# Patient Record
Sex: Female | Born: 1970 | Race: Black or African American | Hispanic: No | Marital: Single | Smoking: Never smoker
Health system: Southern US, Community
[De-identification: ages and names within clinical notes are randomized; demographics above are authoritative.]

## PROBLEM LIST (undated history)

## (undated) HISTORY — PX: WISDOM TOOTH EXTRACTION: SHX21

---

## 1998-12-09 ENCOUNTER — Encounter: Payer: Self-pay | Admitting: Family Medicine

## 1998-12-09 ENCOUNTER — Ambulatory Visit (HOSPITAL_COMMUNITY): Admission: RE | Admit: 1998-12-09 | Discharge: 1998-12-09 | Payer: Self-pay | Admitting: Family Medicine

## 2007-06-19 ENCOUNTER — Emergency Department (HOSPITAL_COMMUNITY): Admission: EM | Admit: 2007-06-19 | Discharge: 2007-06-19 | Payer: Self-pay | Admitting: Emergency Medicine

## 2007-06-22 ENCOUNTER — Emergency Department (HOSPITAL_COMMUNITY): Admission: EM | Admit: 2007-06-22 | Discharge: 2007-06-22 | Payer: Self-pay | Admitting: Family Medicine

## 2007-06-23 ENCOUNTER — Emergency Department (HOSPITAL_COMMUNITY): Admission: EM | Admit: 2007-06-23 | Discharge: 2007-06-23 | Payer: Self-pay | Admitting: Family Medicine

## 2007-06-24 ENCOUNTER — Emergency Department (HOSPITAL_COMMUNITY): Admission: EM | Admit: 2007-06-24 | Discharge: 2007-06-24 | Payer: Self-pay | Admitting: Family Medicine

## 2009-03-30 ENCOUNTER — Emergency Department (HOSPITAL_COMMUNITY): Admission: EM | Admit: 2009-03-30 | Discharge: 2009-03-30 | Payer: Self-pay | Admitting: Family Medicine

## 2009-04-08 ENCOUNTER — Emergency Department (HOSPITAL_COMMUNITY): Admission: EM | Admit: 2009-04-08 | Discharge: 2009-04-08 | Payer: Self-pay | Admitting: Emergency Medicine

## 2010-05-18 ENCOUNTER — Ambulatory Visit (HOSPITAL_COMMUNITY): Admission: RE | Admit: 2010-05-18 | Discharge: 2010-05-18 | Payer: Self-pay | Admitting: Obstetrics & Gynecology

## 2010-05-26 ENCOUNTER — Emergency Department (HOSPITAL_COMMUNITY): Admission: EM | Admit: 2010-05-26 | Discharge: 2010-05-26 | Payer: Self-pay | Admitting: Emergency Medicine

## 2011-02-20 LAB — POCT PREGNANCY, URINE: Preg Test, Ur: NEGATIVE

## 2011-02-20 LAB — URINE MICROSCOPIC-ADD ON

## 2011-02-20 LAB — URINALYSIS, ROUTINE W REFLEX MICROSCOPIC
Bilirubin Urine: NEGATIVE
Glucose, UA: NEGATIVE mg/dL
Ketones, ur: NEGATIVE mg/dL
Nitrite: NEGATIVE
Protein, ur: NEGATIVE mg/dL
Specific Gravity, Urine: 1.021 (ref 1.005–1.030)
Urobilinogen, UA: 1 mg/dL (ref 0.0–1.0)
pH: 7 (ref 5.0–8.0)

## 2011-06-27 ENCOUNTER — Other Ambulatory Visit: Payer: Self-pay | Admitting: Family Medicine

## 2011-06-27 DIAGNOSIS — Z1231 Encounter for screening mammogram for malignant neoplasm of breast: Secondary | ICD-10-CM

## 2011-07-07 ENCOUNTER — Ambulatory Visit (HOSPITAL_COMMUNITY)
Admission: RE | Admit: 2011-07-07 | Discharge: 2011-07-07 | Disposition: A | Discharge status: Home / Self Care | Payer: Self-pay | Source: Ambulatory Visit | Attending: Family Medicine | Admitting: Family Medicine

## 2011-07-07 DIAGNOSIS — Z1231 Encounter for screening mammogram for malignant neoplasm of breast: Secondary | ICD-10-CM

## 2011-09-19 LAB — CBC
HCT: 39.2
Hemoglobin: 13.4
MCHC: 34
MCV: 91.3
Platelets: 354
RBC: 4.29
RDW: 12.4
WBC: 13.5 — ABNORMAL HIGH

## 2011-09-19 LAB — DIFFERENTIAL
Basophils Absolute: 0
Basophils Relative: 0
Eosinophils Absolute: 0.1
Eosinophils Relative: 0
Lymphocytes Relative: 16
Lymphs Abs: 2.1
Monocytes Absolute: 0.7
Monocytes Relative: 5
Neutro Abs: 10.6 — ABNORMAL HIGH
Neutrophils Relative %: 79 — ABNORMAL HIGH

## 2011-09-19 LAB — POCT RAPID STREP A
Streptococcus, Group A Screen (Direct): NEGATIVE
Streptococcus, Group A Screen (Direct): NEGATIVE

## 2011-10-21 ENCOUNTER — Emergency Department (HOSPITAL_COMMUNITY)
Admission: EM | Admit: 2011-10-21 | Discharge: 2011-10-21 | Disposition: A | Discharge status: Home / Self Care | Payer: Self-pay | Attending: Emergency Medicine | Admitting: Emergency Medicine

## 2011-10-21 DIAGNOSIS — R112 Nausea with vomiting, unspecified: Secondary | ICD-10-CM | POA: Insufficient documentation

## 2011-10-21 DIAGNOSIS — J029 Acute pharyngitis, unspecified: Secondary | ICD-10-CM | POA: Insufficient documentation

## 2011-10-21 DIAGNOSIS — R197 Diarrhea, unspecified: Secondary | ICD-10-CM | POA: Insufficient documentation

## 2011-10-21 DIAGNOSIS — R599 Enlarged lymph nodes, unspecified: Secondary | ICD-10-CM | POA: Insufficient documentation

## 2011-10-21 DIAGNOSIS — J069 Acute upper respiratory infection, unspecified: Secondary | ICD-10-CM | POA: Insufficient documentation

## 2011-10-21 DIAGNOSIS — A088 Other specified intestinal infections: Secondary | ICD-10-CM | POA: Insufficient documentation

## 2011-10-21 DIAGNOSIS — A084 Viral intestinal infection, unspecified: Secondary | ICD-10-CM

## 2011-10-21 MED ORDER — IBUPROFEN 600 MG PO TABS: 600.0000 mg | ORAL_TABLET | Freq: Three times a day (TID) | ORAL | Status: AC | PRN

## 2011-10-21 MED ORDER — ONDANSETRON 8 MG PO TBDP: 8.0000 mg | ORAL_TABLET | Freq: Three times a day (TID) | ORAL | Status: AC | PRN

## 2011-10-21 MED ORDER — HYDROCODONE-ACETAMINOPHEN 5-325 MG PO TABS: 2.0000 | ORAL_TABLET | ORAL | Status: AC | PRN

## 2011-10-21 MED ORDER — ONDANSETRON 4 MG PO TBDP
8.0000 mg | ORAL_TABLET | Freq: Once | ORAL | Status: AC
  Administered 2011-10-21: 8 mg via ORAL
  Filled 2011-10-21: qty 1

## 2011-10-21 MED ORDER — PSEUDOEPHEDRINE HCL ER 120 MG PO TB12: 120.0000 mg | ORAL_TABLET | Freq: Two times a day (BID) | ORAL | Status: AC | PRN

## 2011-10-21 MED ORDER — IBUPROFEN 200 MG PO TABS
600.0000 mg | ORAL_TABLET | Freq: Once | ORAL | Status: AC
  Administered 2011-10-21: 600 mg via ORAL
  Filled 2011-10-21: qty 3

## 2011-10-21 MED ORDER — HYDROCODONE-ACETAMINOPHEN 5-325 MG PO TABS
2.0000 | ORAL_TABLET | Freq: Once | ORAL | Status: AC
  Administered 2011-10-21: 2 via ORAL
  Filled 2011-10-21: qty 2

## 2011-10-21 NOTE — ED Provider Notes (Signed)
History     CSN: [id number] Arrival date & time: 10/21/2011 12:55 PM   First MD Initiated Contact with Patient 10/21/11 1316      Chief Complaint  Patient presents with  . Sore Throat  . Diarrhea    (Consider location/radiation/quality/duration/timing/severity/associated sxs/prior treatment) Patient is a 40 y.o. female presenting with pharyngitis, diarrhea, and URI. The history is provided by the patient.  Sore Throat This is a new problem. The current episode started 2 days ago. The problem occurs constantly. The problem has not changed since onset.Pertinent negatives include no chest pain, no abdominal pain, no headaches and no shortness of breath. The symptoms are aggravated by swallowing. The symptoms are relieved by nothing. She has tried nothing for the symptoms.  Diarrhea The primary symptoms include nausea, vomiting and diarrhea. Primary symptoms do not include fever, weight loss, fatigue, abdominal pain, melena, hematemesis, jaundice, hematochezia, dysuria, myalgias, arthralgias or rash. The illness began 2 days ago. The onset was gradual. The problem has not changed since onset. Nausea began 2 days ago. The nausea is associated with eating. The nausea is exacerbated by food.  The vomiting began today. Vomiting occurred once. The emesis contains stomach contents.  The illness does not include chills, anorexia, dysphagia, odynophagia, bloating, constipation, tenesmus, back pain or itching.  URI The primary symptoms include sore throat, swollen glands, cough, nausea and vomiting. Primary symptoms do not include fever, fatigue, headaches, ear pain, wheezing, abdominal pain, myalgias, arthralgias or rash. The current episode started 2 days ago. This is a new problem. The problem has not changed since onset. The sore throat is not accompanied by trouble swallowing, drooling or stridor.  The swelling is not associated with trouble swallowing or neck pain.   The cough began 2 days ago.  The cough is new. The cough is non-productive.  The vomiting began today. Vomiting occurred once. The emesis contains stomach contents.  Symptoms associated with the illness include congestion and rhinorrhea. The illness is not associated with chills or sinus pressure.    History reviewed. No pertinent past medical history.  History reviewed. No pertinent past surgical history.  History reviewed. No pertinent family history.  History  Substance Use Topics  . Smoking status: Never Smoker   . Smokeless tobacco: Not on file  . Alcohol Use: No    OB History    Grav Para Term Preterm Abortions TAB SAB Ect Mult Living                  Review of Systems  Constitutional: Negative for fever, chills, weight loss, diaphoresis, activity change, appetite change, fatigue and unexpected weight change.  HENT: Positive for congestion, sore throat, rhinorrhea and postnasal drip. Negative for hearing loss, ear pain, nosebleeds, facial swelling, sneezing, drooling, mouth sores, trouble swallowing, neck pain, neck stiffness, dental problem, voice change, sinus pressure, tinnitus and ear discharge.   Eyes: Negative for photophobia, pain, discharge, redness and itching.  Respiratory: Positive for cough. Negative for choking, chest tightness, shortness of breath, wheezing and stridor.   Cardiovascular: Negative for chest pain, palpitations and leg swelling.  Gastrointestinal: Positive for nausea, vomiting and diarrhea. Negative for dysphagia, abdominal pain, constipation, blood in stool, melena, hematochezia, abdominal distention, anal bleeding, bloating, anorexia, hematemesis and jaundice.  Genitourinary: Negative for dysuria, urgency, frequency, hematuria, flank pain and difficulty urinating.  Musculoskeletal: Negative for myalgias, back pain, joint swelling, arthralgias and gait problem.  Skin: Negative for color change, itching, pallor, rash and wound.  Neurological: Negative for dizziness,  weakness,  light-headedness and headaches.  Hematological: Positive for adenopathy. Does not bruise/bleed easily.  Psychiatric/Behavioral: Negative.     Allergies  Review of patient's allergies indicates no known allergies.  Home Medications   Current Outpatient Rx  Name Route Sig Dispense Refill  . IBUPROFEN 200 MG PO TABS Oral Take 100-200 mg by mouth every 6 (six) hours as needed. For pain     . HYDROCODONE-ACETAMINOPHEN 5-325 MG PO TABS Oral Take 2 tablets by mouth every 4 (four) hours as needed for pain (Cough). 20 tablet 0  . IBUPROFEN 600 MG PO TABS Oral Take 1 tablet (600 mg total) by mouth every 8 (eight) hours as needed for pain, fever or headache. 20 tablet 0  . ONDANSETRON 8 MG PO TBDP Oral Take 1 tablet (8 mg total) by mouth every 8 (eight) hours as needed for nausea. 20 tablet 0  . PSEUDOEPHEDRINE HCL 120 MG PO TB12 Oral Take 1 tablet (120 mg total) by mouth every 12 (twelve) hours as needed for congestion. 12 tablet 0    BP 120/83  Pulse 86  Temp(Src) 97.8 F (36.6 C) (Oral)  Resp 18  SpO2 99%  LMP 10/10/2011  Physical Exam  Nursing note and vitals reviewed. Constitutional: She is oriented to person, place, and time. She appears well-developed and well-nourished. No distress.  HENT:  Head: Normocephalic and atraumatic.  Right Ear: Hearing, tympanic membrane, external ear and ear canal normal.  Left Ear: Hearing, tympanic membrane, external ear and ear canal normal.  Nose: Mucosal edema and rhinorrhea present. No sinus tenderness or nasal deformity. Right sinus exhibits no maxillary sinus tenderness and no frontal sinus tenderness. Left sinus exhibits no maxillary sinus tenderness and no frontal sinus tenderness.  Mouth/Throat: Uvula is midline, oropharynx is clear and moist and mucous membranes are normal. Normal dentition. No dental abscesses, uvula swelling or dental caries. No oropharyngeal exudate, posterior oropharyngeal edema, posterior oropharyngeal erythema or tonsillar  abscesses.  Eyes: Conjunctivae and EOM are normal. Pupils are equal, round, and reactive to light. Right eye exhibits no discharge. Left eye exhibits no discharge.  Neck: Normal range of motion. Neck supple. No JVD present. No tracheal deviation present.  Cardiovascular: Normal rate, regular rhythm, normal heart sounds and intact distal pulses.  Exam reveals no gallop and no friction rub.   No murmur heard. Pulmonary/Chest: Effort normal and breath sounds normal. No stridor. No respiratory distress. She has no wheezes. She has no rales. She exhibits no tenderness.  Abdominal: Soft. Bowel sounds are normal. She exhibits no distension. There is no tenderness. There is no rebound and no guarding.  Musculoskeletal: Normal range of motion. She exhibits no edema and no tenderness.  Lymphadenopathy:    She has cervical adenopathy.  Neurological: She is alert and oriented to person, place, and time. She has normal reflexes. No cranial nerve deficit. She exhibits normal muscle tone. Coordination normal.  Skin: Skin is warm and dry. No rash noted. She is not diaphoretic. No erythema. No pallor.  Psychiatric: She has a normal mood and affect. Her behavior is normal. Judgment and thought content normal.    ED Course  Procedures (including critical care time)   Labs Reviewed  RAPID STREP SCREEN   No results found.   1. Viral upper respiratory tract infection with cough   2. Viral pharyngitis   3. Viral gastroenteritis       MDM  The patient has an apparent viral upper respiratory tract infection with viral gastroenteritis. I will treat  her symptomatically. No strep throat by testing.        Felisa Bonier, MD 10/21/11 [phone]

## 2011-10-21 NOTE — ED Notes (Signed)
Patient presents with sore,scratchy throat, diarrhea, cough, nausea and 1 episode of vomiting.

## 2012-08-15 ENCOUNTER — Other Ambulatory Visit (HOSPITAL_COMMUNITY): Payer: Self-pay | Admitting: Physician Assistant

## 2012-08-15 DIAGNOSIS — Z1231 Encounter for screening mammogram for malignant neoplasm of breast: Secondary | ICD-10-CM

## 2012-08-28 ENCOUNTER — Ambulatory Visit (HOSPITAL_COMMUNITY): Payer: Self-pay

## 2012-09-07 ENCOUNTER — Ambulatory Visit (HOSPITAL_COMMUNITY)
Admission: RE | Admit: 2012-09-07 | Discharge: 2012-09-07 | Disposition: A | Discharge status: Home / Self Care | Payer: Self-pay | Source: Ambulatory Visit | Attending: Physician Assistant | Admitting: Physician Assistant

## 2012-09-07 DIAGNOSIS — Z1231 Encounter for screening mammogram for malignant neoplasm of breast: Secondary | ICD-10-CM

## 2013-01-14 ENCOUNTER — Emergency Department (INDEPENDENT_AMBULATORY_CARE_PROVIDER_SITE_OTHER)
Admission: EM | Admit: 2013-01-14 | Discharge: 2013-01-14 | Disposition: A | Discharge status: Home / Self Care | Payer: Self-pay | Source: Home / Self Care

## 2013-01-14 ENCOUNTER — Encounter (HOSPITAL_COMMUNITY): Payer: Self-pay | Admitting: Emergency Medicine

## 2013-01-14 DIAGNOSIS — S39012A Strain of muscle, fascia and tendon of lower back, initial encounter: Secondary | ICD-10-CM

## 2013-01-14 DIAGNOSIS — S335XXA Sprain of ligaments of lumbar spine, initial encounter: Secondary | ICD-10-CM

## 2013-01-14 LAB — POCT PREGNANCY, URINE: Preg Test, Ur: NEGATIVE

## 2013-01-14 LAB — POCT URINALYSIS DIP (DEVICE)
Bilirubin Urine: NEGATIVE
Ketones, ur: NEGATIVE mg/dL
Leukocytes, UA: NEGATIVE
Protein, ur: NEGATIVE mg/dL
Specific Gravity, Urine: 1.02 (ref 1.005–1.030)
pH: 7.5 (ref 5.0–8.0)

## 2013-01-14 MED ORDER — KETOROLAC TROMETHAMINE 60 MG/2ML IM SOLN
INTRAMUSCULAR | Status: AC
  Filled 2013-01-14: qty 2

## 2013-01-14 MED ORDER — KETOROLAC TROMETHAMINE 60 MG/2ML IM SOLN
60.0000 mg | Freq: Once | INTRAMUSCULAR | Status: AC
  Administered 2013-01-14: 60 mg via INTRAMUSCULAR

## 2013-01-14 MED ORDER — HYDROCODONE-ACETAMINOPHEN 5-325 MG PO TABS: 1.0000 | ORAL_TABLET | ORAL | Status: DC | PRN

## 2013-01-14 MED ORDER — CYCLOBENZAPRINE HCL 5 MG PO TABS: 5.0000 mg | ORAL_TABLET | Freq: Three times a day (TID) | ORAL | Status: DC | PRN

## 2013-01-14 MED ORDER — NAPROXEN 500 MG PO TABS: 500.0000 mg | ORAL_TABLET | Freq: Two times a day (BID) | ORAL | Status: DC

## 2013-01-14 NOTE — ED Notes (Signed)
Pt c/o lower back pain since 01/06/13 Pain is constant and gradually getting worse; increases w/acitivity or sitting for long periods Recalls picking up heavy boxes at work prior to pain Has tried [employer] cream and heating pads w/little relief Also took left over Vicodin from a tooth ache yest  Denies: hematuria, adb pain, dysuria, f/v/n/d  She is alert w/no signs of acute distress.

## 2013-01-14 NOTE — ED Provider Notes (Signed)
History     CSN: [id number]  Arrival date & time 01/14/13  1231   First MD Initiated Contact with Patient 01/14/13 1315      Chief Complaint  Patient presents with  . Back Pain    (Consider location/radiation/quality/duration/timing/severity/associated sxs/prior treatment) HPI Comments: 42 year old female is complaining of low back pain for one week. Her job requires her to lift boxes and do some repetitive work. Pain is located in the low mid back at the lumbar level. It is worse with prolonged standing, prolonged sitting, bending, pulling and twisting. There is no radiation of pain and no radiculopathy. No history of trauma, falls or other type injury. She has been applying OTC creams and taking some leftover Vicodin.   History reviewed. No pertinent past medical history.  History reviewed. No pertinent past surgical history.  No family history on file.  History  Substance Use Topics  . Smoking status: Never Smoker   . Smokeless tobacco: Not on file  . Alcohol Use: No    OB History   Grav Para Term Preterm Abortions TAB SAB Ect Mult Living                  Review of Systems  Constitutional: Negative for fever, chills and activity change.  HENT: Negative.   Respiratory: Negative.   Cardiovascular: Negative.   Musculoskeletal:       As per HPI  Skin: Negative for color change, pallor and rash.  Neurological: Negative.     Allergies  Review of patient's allergies indicates no known allergies.  Home Medications   Current Outpatient Rx  Name  Route  Sig  Dispense  Refill  . Hydrocodone-Acetaminophen (VICODIN PO)   Oral   Take by mouth.         . cyclobenzaprine (FLEXERIL) 5 MG tablet   Oral   Take 1 tablet (5 mg total) by mouth 3 (three) times daily as needed for muscle spasms.   21 tablet   0   . HYDROcodone-acetaminophen (NORCO/VICODIN) 5-325 MG per tablet   Oral   Take 1 tablet by mouth every 4 (four) hours as needed for pain.   15 tablet   0    . ibuprofen (ADVIL,MOTRIN) 200 MG tablet   Oral   Take 100-200 mg by mouth every 6 (six) hours as needed. For pain          . naproxen (NAPROSYN) 500 MG tablet   Oral   Take 1 tablet (500 mg total) by mouth 2 (two) times daily.   20 tablet   0     BP 144/98  Pulse 74  Temp(Src) 99 F (37.2 C) (Oral)  Resp 18  SpO2 100%  LMP 01/13/2013  Physical Exam  Nursing note and vitals reviewed. Constitutional: She is oriented to person, place, and time. She appears well-developed and well-nourished. No distress.  HENT:  Head: Normocephalic and atraumatic.  Eyes: EOM are normal.  Neck: Normal range of motion. Neck supple.  Cardiovascular: Normal rate and normal heart sounds.   Pulmonary/Chest: Effort normal. No respiratory distress.  Musculoskeletal: She exhibits no edema.  Tenderness in the mid bilateral paralumbar musculature. No direct spinal tenderness. No swelling or discoloration. Upon standing she is able to flex 20 due to pain.  Neurological: She is alert and oriented to person, place, and time. She has normal reflexes. No cranial nerve deficit. She exhibits normal muscle tone.  Unable to check straight leg raises because the patient is unable to relax  her quadriceps.  Skin: Skin is warm and dry.  Psychiatric: She has a normal mood and affect.    ED Course  Procedures (including critical care time)  Labs Reviewed  POCT URINALYSIS DIP (DEVICE) - Abnormal; Notable for the following:    Hgb urine dipstick SMALL (*)    Urobilinogen, UA 2.0 (*)    All other components within normal limits  POCT PREGNANCY, URINE   No results found.   1. Lumbar strain, initial encounter       MDM  Toradol 60 mg IM Norco 5   one Q46 hours when necessary pain #15 Flexeril 5 mg one 3 times a day when necessary pain Naprosyn EC 500 mg twice a day with food when necessary pain Apply heat several times during the day or where a ThermaCare wraps Gradually introduce stretching as  demonstrated Followup your primary care doctor. May return for any new symptoms problems or worsening         Hayden Rasmussen, NP 01/14/13 1620

## 2013-01-15 NOTE — ED Provider Notes (Signed)
Medical screening examination/treatment/procedure(s) were performed by resident physician or non-physician practitioner and as supervising physician I was immediately available for consultation/collaboration.   Shanieka Blea DOUGLAS MD.   Xee Hollman D Brittin Janik, MD 01/15/13 1120 

## 2013-09-23 ENCOUNTER — Other Ambulatory Visit (HOSPITAL_COMMUNITY): Payer: Self-pay | Admitting: Physician Assistant

## 2013-09-23 DIAGNOSIS — Z1231 Encounter for screening mammogram for malignant neoplasm of breast: Secondary | ICD-10-CM

## 2013-10-03 ENCOUNTER — Ambulatory Visit (HOSPITAL_COMMUNITY)
Admission: RE | Admit: 2013-10-03 | Discharge: 2013-10-03 | Disposition: A | Discharge status: Home / Self Care | Payer: BC Managed Care – PPO | Source: Ambulatory Visit | Attending: Physician Assistant | Admitting: Physician Assistant

## 2013-10-03 DIAGNOSIS — Z1231 Encounter for screening mammogram for malignant neoplasm of breast: Secondary | ICD-10-CM

## 2014-04-08 ENCOUNTER — Ambulatory Visit (INDEPENDENT_AMBULATORY_CARE_PROVIDER_SITE_OTHER): Payer: BC Managed Care – PPO | Admitting: Nurse Practitioner

## 2014-04-08 ENCOUNTER — Encounter: Payer: Self-pay | Admitting: Nurse Practitioner

## 2014-04-08 VITALS — BP 102/70 | HR 88 | Ht 65.75 in | Wt 184.0 lb

## 2014-04-08 DIAGNOSIS — S4980XA Other specified injuries of shoulder and upper arm, unspecified arm, initial encounter: Secondary | ICD-10-CM

## 2014-04-08 DIAGNOSIS — S46909A Unspecified injury of unspecified muscle, fascia and tendon at shoulder and upper arm level, unspecified arm, initial encounter: Secondary | ICD-10-CM

## 2014-04-08 DIAGNOSIS — R319 Hematuria, unspecified: Secondary | ICD-10-CM

## 2014-04-08 DIAGNOSIS — Z01419 Encounter for gynecological examination (general) (routine) without abnormal findings: Secondary | ICD-10-CM

## 2014-04-08 DIAGNOSIS — S4990XA Unspecified injury of shoulder and upper arm, unspecified arm, initial encounter: Secondary | ICD-10-CM

## 2014-04-08 DIAGNOSIS — Z0000 Encounter for general adult medical examination without abnormal findings: Secondary | ICD-10-CM

## 2014-04-08 DIAGNOSIS — N912 Amenorrhea, unspecified: Secondary | ICD-10-CM

## 2014-04-08 LAB — POCT URINALYSIS DIPSTICK
BILIRUBIN UA: NEGATIVE
Glucose, UA: NEGATIVE
Ketones, UA: NEGATIVE
Leukocytes, UA: NEGATIVE
NITRITE UA: NEGATIVE
PH UA: 6.5
Protein, UA: NEGATIVE
Urobilinogen, UA: 1

## 2014-04-08 LAB — COMPREHENSIVE METABOLIC PANEL
ALBUMIN: 4.1 g/dL (ref 3.5–5.2)
ALT: 20 U/L (ref 0–35)
AST: 21 U/L (ref 0–37)
Alkaline Phosphatase: 74 U/L (ref 39–117)
BUN: 16 mg/dL (ref 6–23)
CALCIUM: 9.6 mg/dL (ref 8.4–10.5)
CHLORIDE: 98 meq/L (ref 96–112)
CO2: 32 mEq/L (ref 19–32)
Creat: 0.73 mg/dL (ref 0.50–1.10)
Glucose, Bld: 89 mg/dL (ref 70–99)
POTASSIUM: 4 meq/L (ref 3.5–5.3)
Sodium: 142 mEq/L (ref 135–145)
TOTAL PROTEIN: 7.7 g/dL (ref 6.0–8.3)
Total Bilirubin: 1 mg/dL (ref 0.2–1.2)

## 2014-04-08 LAB — POCT URINE PREGNANCY: Preg Test, Ur: NEGATIVE

## 2014-04-08 MED ORDER — IBUPROFEN 800 MG PO TABS: 800.0000 mg | ORAL_TABLET | Freq: Three times a day (TID) | ORAL | Status: DC | PRN

## 2014-04-08 MED ORDER — MEDROXYPROGESTERONE ACETATE 10 MG PO TABS: 10.0000 mg | ORAL_TABLET | Freq: Every day | ORAL | Status: DC

## 2014-04-08 NOTE — Progress Notes (Signed)
Patient ID: Brooke Fields, female   DOB: [birth year], 43 y.o.   MRN: [id number] 43 y.o. G3P3003 Ddivorced African American Fe here for annual exam.  Menses was normal on OCP.  Came off in November and had a withdrawal in December. Since then amenorrhea.  She sought care at PCP and she gave her another RX for OCP but she has yet to start her cycle to start the pill.  She is concerned that maybe she is already in menopause.  Vaso symptoms have been there for about three moiinths mosty at night.  She does awaken but is able to go back to sleep.  She was with a company for many years and the company closed.  The employers owned another company and sought her out to come work at the other company.  She is now working odd shifts and times.  This has caused her to be under more stress.  Her working hours vary from day to day going in from 4 -7 am and getting off between 6-8 pm. Not SA X 2 years. 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  She also fell on a concrete steps early Sunday morning striking her left shoulder.  She sustatied superficial abrasions but continues to have pain under left shoulder area and pain with ROM and sleeping. She does not have Ortho.  Patient's last menstrual period was 11/13/2013.          Sexually active: no  The current method of family planning is none.    Exercising: yes  walking Smoker:  no  Health Maintenance: Pap:  04/2013, normal, history of abnormal, neg colpo in late 20's MMG:  09/23/13, Bi-Rads 1: negative  TDaP:  ? Labs: HB:  12.9 Urine:  Trace RBC, 1.0 urobilinogen   reports that she has never smoked. She has never used smokeless tobacco. She reports that she does not drink alcohol or use illicit drugs.  History reviewed. No pertinent past medical history.  Past Surgical History  Procedure Laterality Date  . Wisdom tooth extraction  age 43    Current Outpatient Prescriptions  Medication Sig  Dispense Refill  . Naproxen Sodium (ALEVE) 220 MG CAPS Take 1 capsule by mouth daily.      Marland Kitchen. ibuprofen (ADVIL,MOTRIN) 800 MG tablet Take 1 tablet (800 mg total) by mouth every 8 (eight) hours as needed.  30 tablet  1  . medroxyPROGESTERone (PROVERA) 10 MG tablet Take 1 tablet (10 mg total) by mouth daily.  10 tablet  0   No current facility-administered medications for this visit.    Family History  Problem Relation Age of Onset  . Adopted: Yes  . Family history unknown: Yes    ROS:  Pertinent items are noted in HPI.  Otherwise, a comprehensive ROS was negative.  Exam:   BP 102/70  Pulse 88  Ht 5' 5.75" (1.67 m)  Wt 184 lb (83.462 kg)  BMI 29.93 kg/m2  LMP 11/13/2013 Height: 5' 5.75" (167 cm)  Ht Readings from Last 3 Encounters:  04/08/14 5' 5.75" (1.67 m)    General appearance: alert, cooperative and appears stated age Head: Normocephalic, without obvious abnormality, atraumatic Neck: no adenopathy, supple, symmetrical, trachea midline and thyroid normal to inspection and palpation.  Tender along the left trapezius and deltoid. Lungs: clear to auscultation bilaterally Breasts: normal appearance, no masses or tenderness Heart: regular rate and rhythm Abdomen: soft, non-tender; no masses,  no organomegaly Extremities: extremities normal, atraumatic, no cyanosis or edema except for pain and swelling along the left deltoid with pain at the acromion head and decrease range of moton of left shoulder.  Needed assistance to lay down for comfort. Superficial abrasions. Skin: Skin color, texture, turgor normal. No rashes or lesions Lymph nodes: Cervical, supraclavicular, and axillary nodes normal. No abnormal inguinal nodes palpated Neurologic: Grossly normal   Pelvic: External genitalia:  no lesions              Urethra:  normal appearing urethra with no masses, tenderness or lesions              Bartholin's and Skene's: normal                 Vagina: normal appearing vagina with  normal color and discharge, no lesions              Cervix: anteverted              Pap taken: yes Bimanual Exam:  Uterus:  normal size, contour, position, consistency, mobility, non-tender              Adnexa: no mass, fullness, tenderness               Rectovaginal: Confirms               Anus:  normal sphincter tone, no lesions  A:  Well Woman with normal exam  Post pill amenorrhea that is now 5 months out.  Not SA X 2 years  R/O other hormonal changes that may be cause of amenorrhea  Situational stressors  R/O UTI - asymptomatic  Left shoulder injury 04/06/14 secondary to fall, superficial abrasions and pain with ROM  P:   Reviewed health and wellness pertinent to exam  Pap smear taken today  Mammogram due 10/15  Will get labs and follow, also urine C& S  Provera challenge and rationale explained  Counseled on breast self exam, mammography screening, adequate intake of calcium and vitamin D, diet and exercise return annually or prn  An After Visit Summary was printed and given to the patient.

## 2014-04-08 NOTE — Patient Instructions (Addendum)

## 2014-04-09 LAB — TSH: TSH: 0.867 u[IU]/mL (ref 0.350–4.500)

## 2014-04-09 LAB — PROLACTIN: Prolactin: 10.2 ng/mL

## 2014-04-09 LAB — URINE CULTURE
Colony Count: NO GROWTH
ORGANISM ID, BACTERIA: NO GROWTH

## 2014-04-09 LAB — HEMOGLOBIN A1C
HEMOGLOBIN A1C: 6.1 % — AB (ref <5.7)
Mean Plasma Glucose: 128 mg/dL — ABNORMAL HIGH (ref <117)

## 2014-04-09 LAB — FOLLICLE STIMULATING HORMONE: FSH: 84.3 m[IU]/mL

## 2014-04-09 LAB — HEMOGLOBIN, FINGERSTICK: HEMOGLOBIN, FINGERSTICK: 12.9 g/dL (ref 12.0–16.0)

## 2014-04-10 ENCOUNTER — Telehealth: Payer: Self-pay | Admitting: Nurse Practitioner

## 2014-04-10 NOTE — Telephone Encounter (Signed)
Advised patient of appointment. Patient agreeable. °

## 2014-04-10 NOTE — Telephone Encounter (Signed)
Left message for patient to call back. Need to inform her that she is scheduled w/Amber Dr.Giofree's PA on Wednesday, May 13 @ 0830. Needs to arrive at 8.

## 2014-04-11 ENCOUNTER — Telehealth: Payer: Self-pay | Admitting: *Deleted

## 2014-04-11 LAB — IPS PAP TEST WITH HPV

## 2014-04-11 NOTE — Telephone Encounter (Signed)
Message copied by Luisa DagoPHILLIPS, STEPHANIE C on Fri Apr 11, 2014 11:10 AM ------      Message from: Ria CommentGRUBB, PATRICIA R      Created: Thu Apr 10, 2014  9:29 AM       Let patient know that urine culture is negative ------

## 2014-04-11 NOTE — Telephone Encounter (Signed)
I have attempted to contact this patient by phone with the following results: left message to return my call on answering machine (home/mobile per DPR).  

## 2014-04-13 NOTE — Progress Notes (Signed)
Encounter reviewed by Dr. Brook Silva.  

## 2014-04-14 ENCOUNTER — Telehealth: Payer: Self-pay | Admitting: Nurse Practitioner

## 2014-04-14 NOTE — Telephone Encounter (Signed)
[facility]2012 was normal.  Immunizations also included and sent to be scanned.

## 2014-04-15 ENCOUNTER — Encounter: Payer: Self-pay | Admitting: Nurse Practitioner

## 2014-04-15 NOTE — Telephone Encounter (Signed)
Pt notified in result note.  Additional note sent to PGrubb.

## 2014-04-30 ENCOUNTER — Telehealth: Payer: Self-pay | Admitting: Nurse Practitioner

## 2014-04-30 ENCOUNTER — Other Ambulatory Visit: Payer: Self-pay | Admitting: Obstetrics and Gynecology

## 2014-04-30 DIAGNOSIS — N912 Amenorrhea, unspecified: Secondary | ICD-10-CM

## 2014-04-30 NOTE — Telephone Encounter (Signed)
Spoke with patient. Patient states that she took her last Provera pill on May 15th. Is calling in to let [name], FNP know that she has not yet started cycle. "According to the pamphlet I should expect bleeding within 10-14 days." Advised patient it could take up to the 14 day mark for bleeding to occur if it is going to. Since it is only two days from the two week mark advised would check with covering provider as [name], FNP is out of the office today and give patient a call back with further instructions.  Routing to Dr.Silva as covering CC: [name], FNP   Dr.Silva, does patient need to preform another Provera challenge at this time?

## 2014-04-30 NOTE — Telephone Encounter (Signed)
Patient wanted to let patty know that she still has no had a cycle after taking all of the provera.

## 2014-04-30 NOTE — Telephone Encounter (Signed)
Spoke with patient. Advised of message from Dr.Silva. Patient states that she has to work the rest of the week and does not get off until around 6:30-7:00. Requesting appointment for June 2nd. Lab visit scheduled for June the 2nd at 8:30 am. Patient agreeable to date and time.  Routing to provider for final review. Patient agreeable to disposition. Will close encounter  Routing to Dr.Silva as covering CC: [name], FNP

## 2014-04-30 NOTE — Telephone Encounter (Signed)
Please have patient come in for blood work to check her hormones this week to evaluate further why she is not having her menstruation.   I will place orders.

## 2014-05-06 ENCOUNTER — Other Ambulatory Visit (INDEPENDENT_AMBULATORY_CARE_PROVIDER_SITE_OTHER): Payer: BC Managed Care – PPO

## 2014-05-06 DIAGNOSIS — N912 Amenorrhea, unspecified: Secondary | ICD-10-CM

## 2014-05-06 LAB — TSH: TSH: 1.292 u[IU]/mL (ref 0.350–4.500)

## 2014-05-07 LAB — ESTRADIOL: Estradiol: 21 pg/mL

## 2014-05-07 LAB — FSH/LH
FSH: 80.1 m[IU]/mL
LH: 59.5 m[IU]/mL

## 2014-05-07 LAB — PROLACTIN: Prolactin: 12.7 ng/mL

## 2014-05-13 ENCOUNTER — Ambulatory Visit (INDEPENDENT_AMBULATORY_CARE_PROVIDER_SITE_OTHER): Payer: BC Managed Care – PPO | Admitting: *Deleted

## 2014-05-13 VITALS — BP 108/70 | HR 68 | Ht 65.75 in | Wt 185.0 lb

## 2014-05-13 DIAGNOSIS — Z23 Encounter for immunization: Secondary | ICD-10-CM

## 2014-05-13 NOTE — Progress Notes (Signed)
Patient in today for Tdap injection. Patient tolerated shot well. - She made appt for tomorrow at 10:15 am to discuss Labs.

## 2014-05-14 ENCOUNTER — Encounter: Payer: Self-pay | Admitting: Nurse Practitioner

## 2014-05-14 ENCOUNTER — Encounter (INDEPENDENT_AMBULATORY_CARE_PROVIDER_SITE_OTHER): Payer: BC Managed Care – PPO | Admitting: Nurse Practitioner

## 2014-05-27 ENCOUNTER — Encounter: Payer: Self-pay | Admitting: Nurse Practitioner

## 2014-05-27 ENCOUNTER — Ambulatory Visit (INDEPENDENT_AMBULATORY_CARE_PROVIDER_SITE_OTHER): Payer: BC Managed Care – PPO | Admitting: Nurse Practitioner

## 2014-05-27 VITALS — BP 90/66 | HR 64 | Ht 65.75 in | Wt 186.0 lb

## 2014-05-27 DIAGNOSIS — E28319 Asymptomatic premature menopause: Secondary | ICD-10-CM

## 2014-05-27 MED ORDER — MEDROXYPROGESTERONE ACETATE 2.5 MG PO TABS: 2.5000 mg | ORAL_TABLET | Freq: Every day | ORAL | Status: AC

## 2014-05-27 MED ORDER — ESTRADIOL 0.5 MG PO TABS: 0.5000 mg | ORAL_TABLET | Freq: Every day | ORAL | Status: AC

## 2014-05-27 NOTE — Patient Instructions (Signed)

## 2014-05-27 NOTE — Progress Notes (Signed)
Patient ID: [name], female   DOB: 10-May-1971, 43 y.o.   MRN: [id number]  S: This 43 yo G3P3003 DAA Fe here for a consult to discuss menopause.  Her LMP was 11/13/13.  At [facility] on 5/5/she was given a Provera challenge and did not get a withdrawal bleed.  Her FSH at that time was 84.3 with a prolactin of 10.2 and TSH of 0.867.  After the provera and no withdrawal bleed, the labs were repeated and FSH was 80.1, LH 59.5, prolactin 12.7, and TSH of 1.292.  She was thinking she did not want HRT initially.  Her vaso symptoms did improve while on Provera and then became worse again.  She is now having vaso symptoms hourly and worse at night.  This interrupts her sleep pattern and feeling very tired during the day.  She has been short and maybe edgy at times. She is not SA so vaginal dryness has not been a problem.  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   No [facility]. She is adopted but was raised within her biological family unit.  Her last Mammo was 10/04/13.  No history of blood dyscrasia. No history of hypercholesterolemia.  A: Menopausal at age 43  Menopausal  symptoms that are significant   P: Discussed at length the WHI Study with potential benefits and risk.  Including DVT,CVA, cancer, etc.  She is aware of quality of life issues vs. these risks and wishes to proceed with HRT.  She is given Estradiol 0.5 mg tab daily and Provera 2.5 mg daily for 3 months.  She will then return for a consult visit.  She is made aware of SBE monthly along with yearly Mammo.  She is aware of warning signs of DVT and with long distance travel and prevention of DVT.  She is aware that she may get vaginal spotting since only 6 months from LMP.  If she gets prolonged vaginal bleeding or heavy bleeding to call ASAP. She may use extra virgin olive oil after bath to the labia to help with any dryness problems.  She is given hand  out info on HRT and menopause.  Consult time:  18 minutes face to face

## 2014-06-02 NOTE — Progress Notes (Signed)
Encounter reviewed by Dr. Brook Silva.  

## 2014-06-08 NOTE — Progress Notes (Signed)
This encounter was created in error - please disregard.

## 2014-08-22 ENCOUNTER — Other Ambulatory Visit: Payer: Self-pay | Admitting: Nurse Practitioner

## 2014-08-22 NOTE — Telephone Encounter (Signed)
Last AEX: 04/08/14 Last refill:05/27/14 #90 X 0 Current AEX:04/10/15  Please advise

## 2014-08-24 NOTE — Telephone Encounter (Signed)
Patient need both Estradiol and Provera - but I see refill for only one of them.  Also she was to come back in for a 3 month recheck.  If not yet scheduled have her to do that.  She can then have a months worth until OV.  We want to check her progress since she had such dramatic symptoms.

## 2014-08-25 NOTE — Telephone Encounter (Signed)
S/w pt. She has an appt with Patti tomorrow at 2:15. Told pt that we can do the refills then. Pt voiced understanding and agreed

## 2014-08-25 NOTE — Telephone Encounter (Signed)
lmtcb concerning rx and appt

## 2014-08-26 ENCOUNTER — Ambulatory Visit (INDEPENDENT_AMBULATORY_CARE_PROVIDER_SITE_OTHER): Payer: BC Managed Care – PPO | Admitting: Nurse Practitioner

## 2014-08-26 ENCOUNTER — Encounter: Payer: Self-pay | Admitting: Nurse Practitioner

## 2014-08-26 ENCOUNTER — Ambulatory Visit: Payer: BC Managed Care – PPO | Admitting: Nurse Practitioner

## 2014-08-26 VITALS — BP 104/76 | HR 84 | Ht 65.75 in | Wt 193.0 lb

## 2014-08-26 DIAGNOSIS — IMO0001 Reserved for inherently not codable concepts without codable children: Secondary | ICD-10-CM

## 2014-08-26 NOTE — Progress Notes (Signed)
Encounter reviewed by Dr. Auriana Scalia Silva.  

## 2014-08-26 NOTE — Progress Notes (Signed)
Patient ID: Brooke Fields, female   DOB: [birth year], 43 y.o.   MRN: [id number]  S: This 43 yo [id number] DAA Fe here for a follow up consult to discuss menopause. She started on HRT with Estrace 0.5 mg daily and Provera 2.5 mg daily on 05/27/14.  Since then she is sleeping so much better, vaso symptoms are tolerable and mood changes have improved.  She really likes the HRT.  She denies any vaginal bleeding or spotting.  She has noted about the same time an increase in generalized joint pain. It occurs first thing in the morning and better by late morning.  She states these areas can change from hips, hands, knees, and elbows.  She visited with her father and took some of his Tylenol Arthritis formula and felt better.  She does stand about 8-12 hours a day with her job since December.  No history of trauma or injury.  Denies pain in the calf areas.  PMH: Her LMP was 11/13/13. At [facility] on 5/5/she was given a Provera challenge and did not get a withdrawal bleed. Her FSH at that time was 84.3 with a prolactin of 10.2 and TSH of 0.867. After the provera and no withdrawal bleed, the labs were repeated and FSH was 80.1, LH 59.5, prolactin 12.7, and TSH of 1.292. She was thinking she did not want HRT initially. Her vaso symptoms did improve while on Provera and then became worse again. She was having vaso symptoms hourly and worse at night. This interrupts her sleep pattern and feeling very tired during the day. She has been short and maybe edgy at times. She is not SA so vaginal dryness has not been a problem   O: No edema, pain, swelling of lower extremities, pulse is normal.  Assessment: Postmenopausal on HRT since 05/27/14   New onset of multiple joint pain   No evidence of DVT   Plan:   Refer to Rheumatologist Dr. Orlin Hilding   Hold HRT for now until evaluation - while she is off will note if these symptoms go away/ stop.  Consult time:  15 minutes face to face

## 2014-08-26 NOTE — Patient Instructions (Signed)
We will call you with a Rheumatologist

## 2014-08-28 ENCOUNTER — Ambulatory Visit: Payer: BC Managed Care – PPO | Admitting: Nurse Practitioner

## 2014-10-06 ENCOUNTER — Encounter: Payer: Self-pay | Admitting: Nurse Practitioner

## 2014-10-23 ENCOUNTER — Other Ambulatory Visit (HOSPITAL_COMMUNITY): Payer: Self-pay | Admitting: Physician Assistant

## 2014-10-23 DIAGNOSIS — Z1231 Encounter for screening mammogram for malignant neoplasm of breast: Secondary | ICD-10-CM

## 2014-10-28 ENCOUNTER — Ambulatory Visit (HOSPITAL_COMMUNITY): Payer: BC Managed Care – PPO | Attending: Physician Assistant

## 2014-11-14 ENCOUNTER — Ambulatory Visit (HOSPITAL_COMMUNITY)
Admission: RE | Admit: 2014-11-14 | Discharge: 2014-11-14 | Disposition: A | Discharge status: Home / Self Care | Payer: BC Managed Care – PPO | Source: Ambulatory Visit | Attending: Physician Assistant | Admitting: Physician Assistant

## 2014-11-14 DIAGNOSIS — Z1231 Encounter for screening mammogram for malignant neoplasm of breast: Secondary | ICD-10-CM

## 2015-02-04 ENCOUNTER — Encounter (HOSPITAL_COMMUNITY): Payer: Self-pay

## 2015-02-04 ENCOUNTER — Emergency Department (HOSPITAL_COMMUNITY): Payer: 59

## 2015-02-04 ENCOUNTER — Emergency Department (HOSPITAL_COMMUNITY)
Admission: EM | Admit: 2015-02-04 | Discharge: 2015-02-04 | Disposition: A | Discharge status: Home / Self Care | Payer: 59 | Attending: Emergency Medicine | Admitting: Emergency Medicine

## 2015-02-04 DIAGNOSIS — Z79899 Other long term (current) drug therapy: Secondary | ICD-10-CM | POA: Diagnosis not present

## 2015-02-04 DIAGNOSIS — Y92410 Unspecified street and highway as the place of occurrence of the external cause: Secondary | ICD-10-CM | POA: Diagnosis not present

## 2015-02-04 DIAGNOSIS — Z3202 Encounter for pregnancy test, result negative: Secondary | ICD-10-CM | POA: Diagnosis not present

## 2015-02-04 DIAGNOSIS — S3992XA Unspecified injury of lower back, initial encounter: Secondary | ICD-10-CM | POA: Diagnosis present

## 2015-02-04 DIAGNOSIS — Y9389 Activity, other specified: Secondary | ICD-10-CM | POA: Diagnosis not present

## 2015-02-04 DIAGNOSIS — S39012A Strain of muscle, fascia and tendon of lower back, initial encounter: Secondary | ICD-10-CM

## 2015-02-04 DIAGNOSIS — Z791 Long term (current) use of non-steroidal anti-inflammatories (NSAID): Secondary | ICD-10-CM | POA: Insufficient documentation

## 2015-02-04 DIAGNOSIS — S32010A Wedge compression fracture of first lumbar vertebra, initial encounter for closed fracture: Secondary | ICD-10-CM | POA: Diagnosis not present

## 2015-02-04 DIAGNOSIS — Y998 Other external cause status: Secondary | ICD-10-CM | POA: Insufficient documentation

## 2015-02-04 LAB — POC URINE PREG, ED: Preg Test, Ur: NEGATIVE

## 2015-02-04 MED ORDER — IBUPROFEN 800 MG PO TABS: 800.0000 mg | ORAL_TABLET | Freq: Three times a day (TID) | ORAL | Status: DC | PRN

## 2015-02-04 MED ORDER — HYDROCODONE-ACETAMINOPHEN 5-325 MG PO TABS: 1.0000 | ORAL_TABLET | Freq: Four times a day (QID) | ORAL | Status: DC | PRN

## 2015-02-04 MED ORDER — CYCLOBENZAPRINE HCL 10 MG PO TABS: 10.0000 mg | ORAL_TABLET | Freq: Two times a day (BID) | ORAL | Status: AC | PRN

## 2015-02-04 MED ORDER — HYDROCODONE-ACETAMINOPHEN 5-325 MG PO TABS: 2.0000 | ORAL_TABLET | ORAL | Status: AC | PRN

## 2015-02-04 MED ORDER — CYCLOBENZAPRINE HCL 10 MG PO TABS
10.0000 mg | ORAL_TABLET | Freq: Once | ORAL | Status: AC
  Administered 2015-02-04: 10 mg via ORAL
  Filled 2015-02-04: qty 1

## 2015-02-04 MED ORDER — HYDROCODONE-ACETAMINOPHEN 5-325 MG PO TABS
2.0000 | ORAL_TABLET | Freq: Once | ORAL | Status: AC
  Administered 2015-02-04: 2 via ORAL
  Filled 2015-02-04: qty 2
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
## 2015-02-04 NOTE — Discharge Instructions (Signed)
Take Vicodin as needed for pain. Take Flexeril as needed for muscle spasm. Refer to attached documents for more information. Follow up with Dr. Shon BatonBrooks for further evaluation. Return to the ED with worsening or concerning symptoms.
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
## 2015-02-13 ENCOUNTER — Encounter: Payer: Self-pay | Admitting: Nurse Practitioner

## 2015-03-19 ENCOUNTER — Encounter: Payer: Self-pay | Admitting: Nurse Practitioner

## 2015-04-10 ENCOUNTER — Ambulatory Visit: Payer: BC Managed Care – PPO | Admitting: Nurse Practitioner

## 2015-09-17 ENCOUNTER — Other Ambulatory Visit: Payer: Self-pay

## 2015-09-17 DIAGNOSIS — Z1231 Encounter for screening mammogram for malignant neoplasm of breast: Secondary | ICD-10-CM

## 2015-11-16 ENCOUNTER — Ambulatory Visit
Admission: RE | Admit: 2015-11-16 | Discharge: 2015-11-16 | Disposition: A | Discharge status: Home / Self Care | Payer: 59 | Source: Ambulatory Visit

## 2015-11-16 DIAGNOSIS — Z1231 Encounter for screening mammogram for malignant neoplasm of breast: Secondary | ICD-10-CM

## 2016-11-15 ENCOUNTER — Other Ambulatory Visit: Payer: Self-pay | Admitting: Physician Assistant

## 2016-11-15 DIAGNOSIS — Z1231 Encounter for screening mammogram for malignant neoplasm of breast: Secondary | ICD-10-CM

## 2016-12-06 ENCOUNTER — Ambulatory Visit: Payer: Self-pay

## 2016-12-21 ENCOUNTER — Ambulatory Visit: Payer: Self-pay

## 2017-01-03 ENCOUNTER — Ambulatory Visit
Admission: RE | Admit: 2017-01-03 | Discharge: 2017-01-03 | Disposition: A | Discharge status: Home / Self Care | Payer: BLUE CROSS/BLUE SHIELD | Source: Ambulatory Visit | Attending: Physician Assistant | Admitting: Physician Assistant

## 2017-01-03 DIAGNOSIS — Z1231 Encounter for screening mammogram for malignant neoplasm of breast: Secondary | ICD-10-CM

## 2017-12-13 ENCOUNTER — Other Ambulatory Visit: Payer: Self-pay | Admitting: Physician Assistant

## 2017-12-13 DIAGNOSIS — Z1231 Encounter for screening mammogram for malignant neoplasm of breast: Secondary | ICD-10-CM

## 2018-01-10 ENCOUNTER — Ambulatory Visit
Admission: RE | Admit: 2018-01-10 | Discharge: 2018-01-10 | Disposition: A | Discharge status: Home / Self Care | Payer: BLUE CROSS/BLUE SHIELD | Source: Ambulatory Visit | Attending: Physician Assistant | Admitting: Physician Assistant

## 2018-01-10 DIAGNOSIS — Z1231 Encounter for screening mammogram for malignant neoplasm of breast: Secondary | ICD-10-CM

## 2018-12-10 ENCOUNTER — Other Ambulatory Visit: Payer: Self-pay | Admitting: Physician Assistant

## 2018-12-10 DIAGNOSIS — Z1231 Encounter for screening mammogram for malignant neoplasm of breast: Secondary | ICD-10-CM

## 2019-01-14 ENCOUNTER — Ambulatory Visit
Admission: RE | Admit: 2019-01-14 | Discharge: 2019-01-14 | Disposition: A | Discharge status: Home / Self Care | Payer: PRIVATE HEALTH INSURANCE | Source: Ambulatory Visit | Attending: Physician Assistant | Admitting: Physician Assistant

## 2019-01-14 DIAGNOSIS — Z1231 Encounter for screening mammogram for malignant neoplasm of breast: Secondary | ICD-10-CM

## 2019-10-31 IMAGING — MG DIGITAL SCREENING BILATERAL MAMMOGRAM WITH TOMO AND CAD
8 series · 8 of 24 positions shown · non-contrast
Comparison: Previous exam(s).

CLINICAL DATA: Screening.

EXAM:
DIGITAL SCREENING BILATERAL MAMMOGRAM WITH TOMO AND CAD

[L MLO synth-2D]
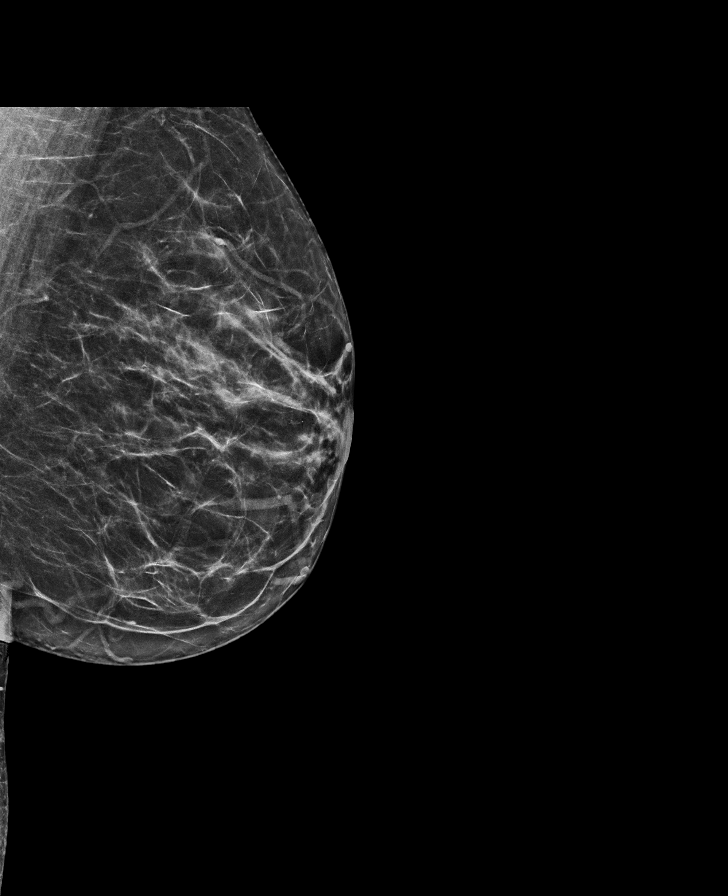

[R MLO synth-2D]
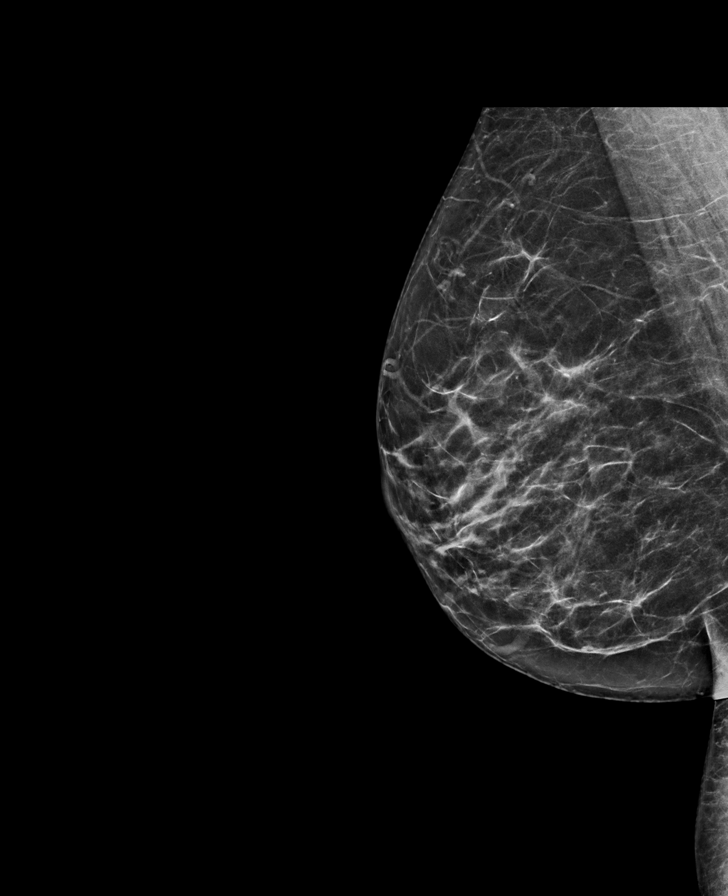

[L CC synth-2D]
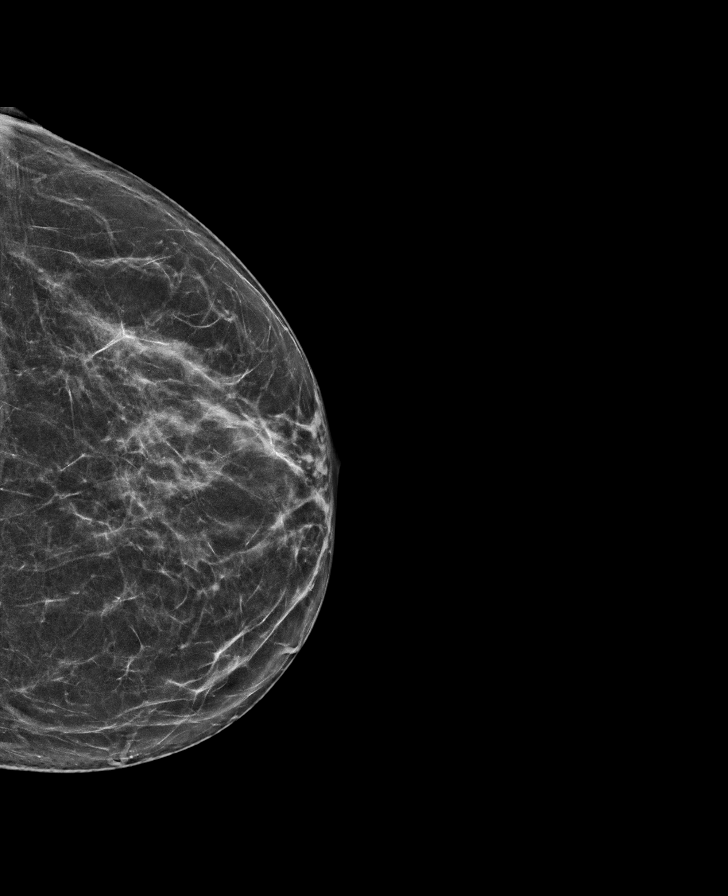

[R CC synth-2D]
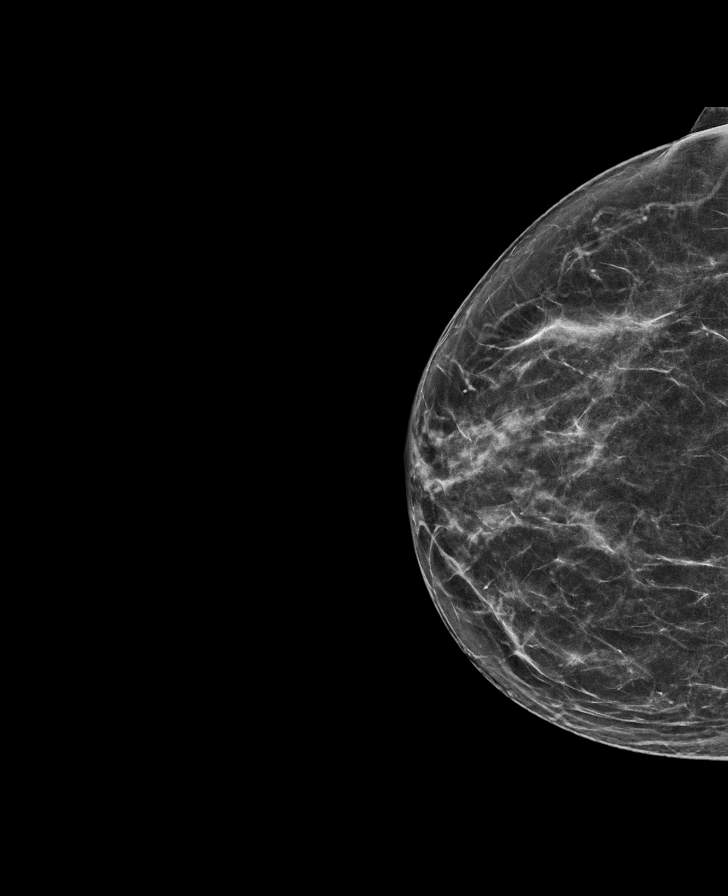

[L CC tomo · tomo slice 33/64.0]
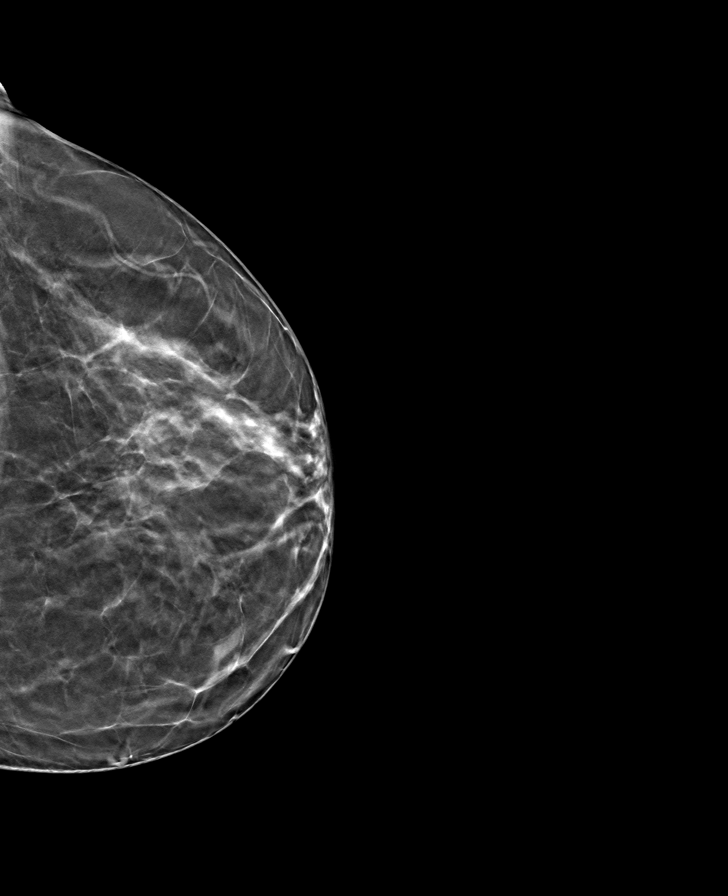

[R CC tomo · tomo slice 35/69.0]
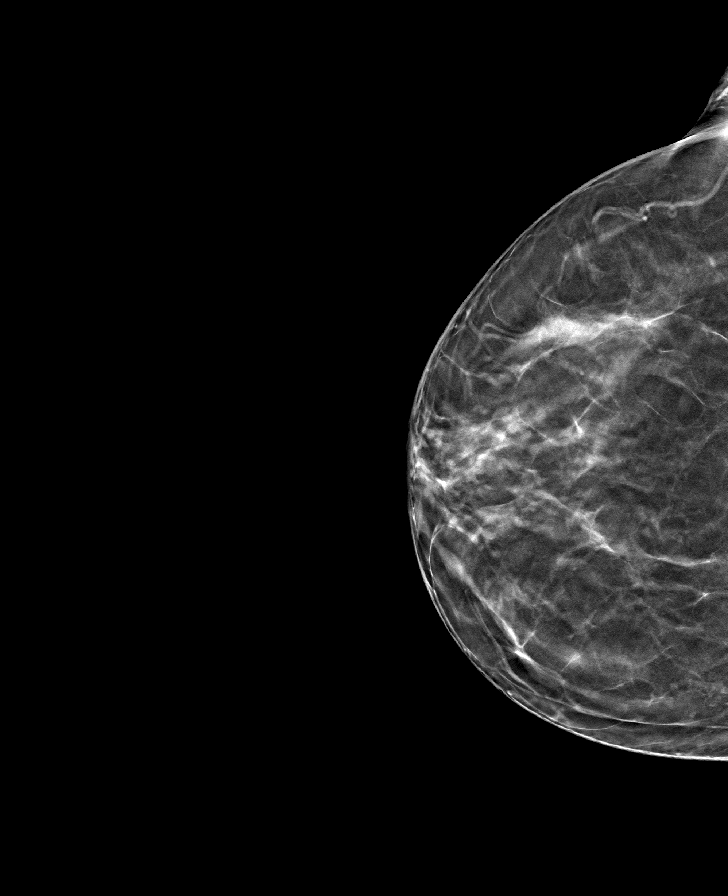

[R MLO tomo · tomo slice 35/69.0]
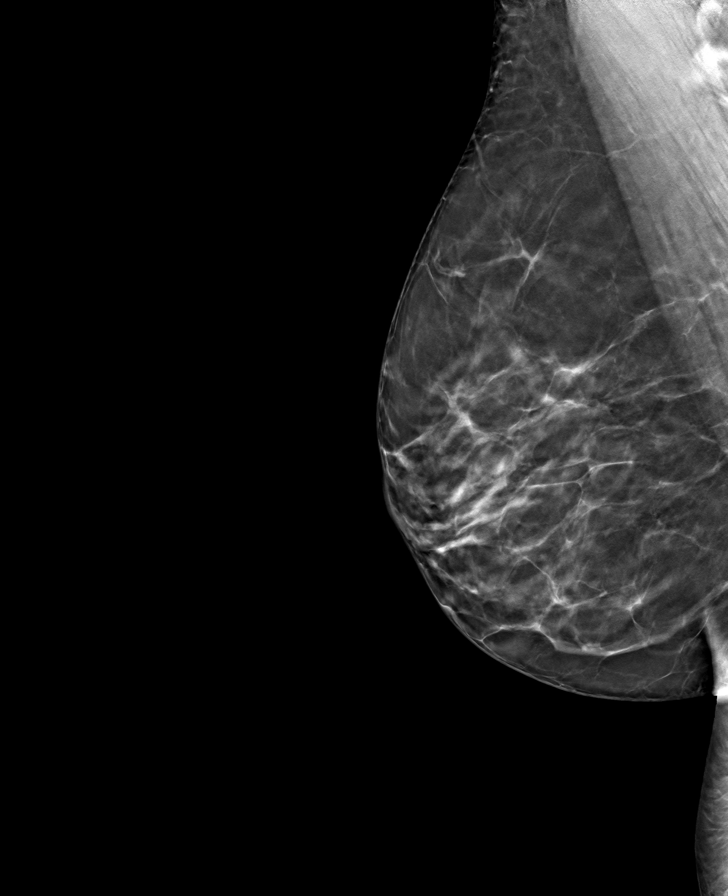

[L MLO tomo · tomo slice 34/67.0]
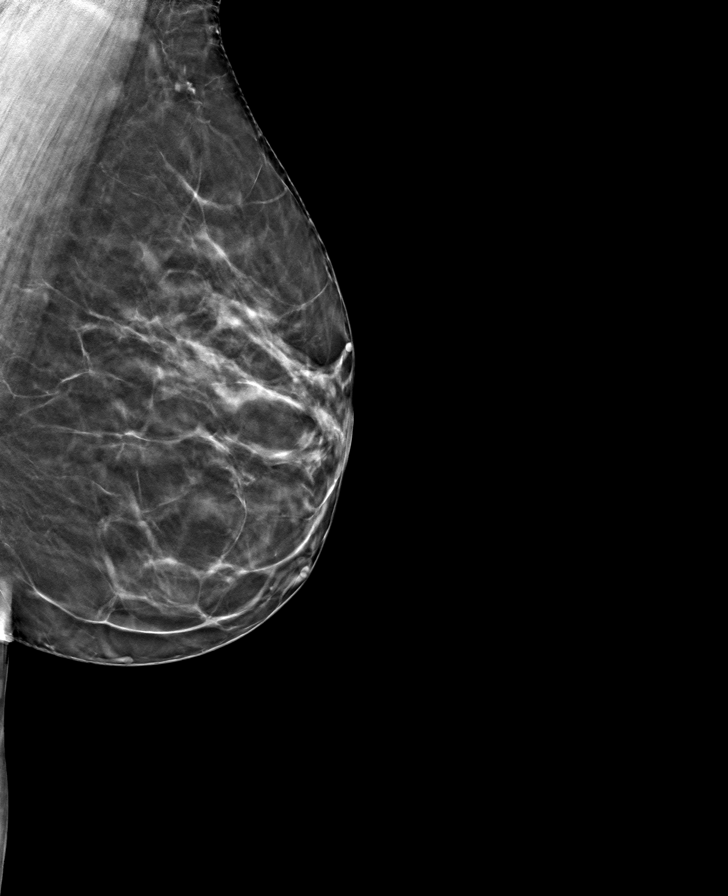

[8 of 24 positions shown; findings below may reference images not displayed]

ACR Breast Density Category b: There are scattered areas of
fibroglandular density.
FINDINGS: There are no findings suspicious for malignancy. Images were
processed with CAD.
IMPRESSION: No mammographic evidence of malignancy. A result letter of this
screening mammogram will be mailed directly to the patient.

RECOMMENDATION:
Screening mammogram in one year. (Code:[id number])

BI-RADS CATEGORY  1: Negative.

## 2019-12-17 ENCOUNTER — Other Ambulatory Visit: Payer: Self-pay | Admitting: Physician Assistant

## 2023-05-02 DIAGNOSIS — Z1231 Encounter for screening mammogram for malignant neoplasm of breast: Secondary | ICD-10-CM | POA: Diagnosis not present

## 2023-05-02 DIAGNOSIS — Z1239 Encounter for other screening for malignant neoplasm of breast: Secondary | ICD-10-CM | POA: Diagnosis not present

## 2023-05-13 DIAGNOSIS — J069 Acute upper respiratory infection, unspecified: Secondary | ICD-10-CM | POA: Diagnosis not present

## 2023-06-26 DIAGNOSIS — Z01419 Encounter for gynecological examination (general) (routine) without abnormal findings: Secondary | ICD-10-CM | POA: Diagnosis not present

## 2023-06-26 DIAGNOSIS — Z13220 Encounter for screening for lipoid disorders: Secondary | ICD-10-CM | POA: Diagnosis not present
